# Patient Record
Sex: Female | Born: 2000 | Race: Black or African American | Hispanic: No | Marital: Single | Smoking: Never smoker
Health system: Southern US, Community
[De-identification: ages and names within clinical notes are randomized; demographics above are authoritative.]

## PROBLEM LIST (undated history)

## (undated) DIAGNOSIS — J45909 Unspecified asthma, uncomplicated: Secondary | ICD-10-CM

## (undated) HISTORY — PX: NO PAST SURGERIES: SHX2092

---
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
## 2022-06-09 ENCOUNTER — Ambulatory Visit (INDEPENDENT_AMBULATORY_CARE_PROVIDER_SITE_OTHER): Payer: Medicaid Other | Admitting: Student

## 2022-06-09 ENCOUNTER — Encounter: Payer: Self-pay | Admitting: Student

## 2022-06-09 VITALS — BP 118/62 | HR 84 | Ht 65.0 in | Wt 178.2 lb

## 2022-06-09 DIAGNOSIS — Z3009 Encounter for other general counseling and advice on contraception: Secondary | ICD-10-CM

## 2022-06-09 DIAGNOSIS — Z3041 Encounter for surveillance of contraceptive pills: Secondary | ICD-10-CM

## 2022-06-09 MED ORDER — NORETHIN-ETH ESTRAD-FE BIPHAS 1 MG-10 MCG / 10 MCG PO TABS: 1.0000 | ORAL_TABLET | Freq: Every day | ORAL | 11 refills | Status: DC

## 2022-06-09 NOTE — Progress Notes (Signed)
    SUBJECTIVE:   CHIEF COMPLAINT / HPI:   Emily Baldwin is a 22 year-old female here to establish care with new PCP.  Occupation: Works at [employer] Lives with: Mother Emergency contact: Mother- [phone] Smoking/Vaping:  None Alcohol: None Marijuana: None Ilicit substances: None   Allergies: None Daily medications: OCP Medical diagnoses: Asthma when younger Surgical hx: Wisdom teeth removal in 2021 Family hx: Nothing   She says that she is here to get a refill on her OCP. She has no history of PE or DVT.  She has not used tobacco products.  No history of migraine with aura.  She does not have hypertension, nor does she have a history of it.  PERTINENT  PMH / PSH: Reviewed  OBJECTIVE:   BP 118/62   Pulse 84   Ht 5\' 5"  (1.651 m)   Wt 178 lb 3.2 oz (80.8 kg)   LMP 05/24/2022   SpO2 98%   BMI 29.65 kg/m  General: Well-appearing female in no distress CV: Regular rate and rhythm Respiratory: Normal work of breathing on room air Abdomen: Soft nontender nondistended Extremities: Warm and well-perfused Psychiatric: Pleasant affect.  Normal mood.  Makes good eye contact.   ASSESSMENT/PLAN:   Patient is a healthy 22 year old female.  Vital signs within normal limits for age today.  No daily medications other than OCP. Discussed that we will have annual visits, and we are here to provide sick care or acute care as well.  Oral contraceptive use No contraindications to OCP. Prescribed LoLoestrin. Discussed safe sex and to use condoms with sexual intercourse to prevent sexually transmitted infections.     Orvis Brill, [place]

## 2022-06-09 NOTE — Patient Instructions (Addendum)
So wonderful meeting you today, Emily Baldwin!  You may return to work when you feel you are able to do your job. I provided a work note for the next week.  Continue icing your finger and use a splint. Make sure to start moving your finger for mobility to prevent stiffness.  We will see you for your annual exam in 1 year or sooner if needed!   If you have any questions or concerns, please feel free to call the clinic.   Have a wonderful day,  Dr. Orvis Brill [facility] [phone]

## 2022-06-12 NOTE — Assessment & Plan Note (Signed)
No contraindications to OCP. Prescribed LoLoestrin. Discussed safe sex and to use condoms with sexual intercourse to prevent sexually transmitted infections.

## 2022-07-14 ENCOUNTER — Other Ambulatory Visit: Payer: Self-pay | Admitting: *Deleted

## 2022-07-14 NOTE — Progress Notes (Signed)
RX Lo Loestrin refill request received from pharmacy. 1 refill sent with note for pt to schedule annual per protocol.

## 2022-07-18 ENCOUNTER — Telehealth: Payer: Self-pay

## 2022-07-18 DIAGNOSIS — Z3009 Encounter for other general counseling and advice on contraception: Secondary | ICD-10-CM

## 2022-07-18 MED ORDER — NORETHIN-ETH ESTRAD-FE BIPHAS 1 MG-10 MCG / 10 MCG PO TABS: 1.0000 | ORAL_TABLET | Freq: Every day | ORAL | 11 refills | Status: DC

## 2022-07-18 NOTE — Telephone Encounter (Signed)
Patient calls nurse line reporting difficulty picking birth control prescription.   I called the pharmacy and they report they do not have a recent prescription prescription with refills.   Please resend prescription.

## 2022-07-19 MED ORDER — NORETHIN-ETH ESTRAD-FE BIPHAS 1 MG-10 MCG / 10 MCG PO TABS: 1.0000 | ORAL_TABLET | Freq: Every day | ORAL | 11 refills | Status: DC

## 2022-07-19 NOTE — Telephone Encounter (Signed)
Script set to no print, not send electronically.  Resent for patient. Christen Bame, CMA

## 2022-07-19 NOTE — Addendum Note (Signed)
Addended by: Christen Bame D on: 07/19/2022 10:06 AM   Modules accepted: Orders

## 2022-07-22 ENCOUNTER — Ambulatory Visit (INDEPENDENT_AMBULATORY_CARE_PROVIDER_SITE_OTHER): Payer: Medicaid Other | Admitting: Student

## 2022-07-22 VITALS — BP 134/84 | HR 84 | Ht 65.0 in | Wt 169.6 lb

## 2022-07-22 DIAGNOSIS — S6990XA Unspecified injury of unspecified wrist, hand and finger(s), initial encounter: Secondary | ICD-10-CM | POA: Insufficient documentation

## 2022-07-22 DIAGNOSIS — S6992XD Unspecified injury of left wrist, hand and finger(s), subsequent encounter: Secondary | ICD-10-CM

## 2022-07-22 DIAGNOSIS — M20002 Unspecified deformity of left finger(s): Secondary | ICD-10-CM | POA: Diagnosis present

## 2022-07-22 NOTE — Progress Notes (Signed)
    SUBJECTIVE:   CHIEF COMPLAINT / HPI:   Emily Baldwin is a 22 y.o. female  presenting for follow up for a non-displaced fracture of the left ring finger in 05/2022. She was seen by urgent care after accidentally slamming her finger into the door. X-ray at the time showing a spiral fracture middle phalanx ring finger that was non-displaced with soft tissue swelling.   She noticed her finger now has a deformity. She is able to use her hand without significant pain but struggles with the strength for flexion. She reports good compliance with finger splint.   PERTINENT  PMH / PSH: asthma, oral contraceptive use   OBJECTIVE:   BP 134/84   Pulse 84   Ht 5' 5"$  (1.651 m)   Wt 169 lb 9.6 oz (76.9 kg)   SpO2 100%   BMI 28.22 kg/m   Well -appearing, no acute distress Cardio: Regular rate, regular rhythm, no murmurs on exam. MSK: swan neck deformity of the ring finger of the left hand, grip strength intact, tenderness to palpation on the DIP joint, normal ROM    ASSESSMENT/PLAN:   Hand injury With new deformity of the finger, will send for evaluation by Hand surgery. Patient in agreement with plan.      Darci Current, [place]

## 2022-07-22 NOTE — Assessment & Plan Note (Signed)
With new deformity of the finger, will send for evaluation by Hand surgery. Patient in agreement with plan.

## 2022-07-22 NOTE — Patient Instructions (Signed)
It was great to see you today!   Today we addressed: I am sending ain a referral to hand surgery. They will take x-rays in their office. You should hear a call back from them in 1-2 weeks, if not please call our office and let me know.   You should return to our clinic No follow-ups on file.  Please arrive 15 minutes before your appointment to ensure smooth check in process.    Please call the clinic at [phone] if your symptoms worsen or you have any concerns.  Thank you for allowing me to participate in your care, Dr. Darci Current [facility]

## 2022-11-11 ENCOUNTER — Ambulatory Visit: Payer: Medicaid Other | Admitting: Family Medicine

## 2022-11-14 ENCOUNTER — Ambulatory Visit (INDEPENDENT_AMBULATORY_CARE_PROVIDER_SITE_OTHER): Payer: Medicaid Other | Admitting: Student

## 2022-11-14 VITALS — BP 120/70 | HR 111 | Ht 66.0 in | Wt 168.0 lb

## 2022-11-14 DIAGNOSIS — N926 Irregular menstruation, unspecified: Secondary | ICD-10-CM | POA: Insufficient documentation

## 2022-11-14 DIAGNOSIS — N912 Amenorrhea, unspecified: Secondary | ICD-10-CM | POA: Diagnosis present

## 2022-11-14 LAB — POCT URINE PREGNANCY: Preg Test, Ur: NEGATIVE

## 2022-11-14 NOTE — Assessment & Plan Note (Signed)
Urine pregnancy negative in clinic today.  Discussed possible causes of a late/absent menstrual cycle including infection (gonorrhea, chlamydia, trichomonas, etc.), anovulation, pregnancy, structural abnormalities. -Plan for repeat urine pregnancy in 1 week (patient prefers to have this done in the clinic) -If she does not have a menstrual cycle within the next week, we will also collect gonorrhea, chlamydia, trichomonas, BV and yeast samples -Discussed return precautions

## 2022-11-14 NOTE — Progress Notes (Signed)
  SUBJECTIVE:   CHIEF COMPLAINT / HPI:   Emily Baldwin is a pleasant 22 year old female here for missed menses.  She has been taking combined OCP since February.  Has had regular menstrual cycles per her phone app every 4 weeks. LMP 10/10/2022.  She is feeling some slight cramping and headache.  Also having sweating at night. Otherwise, no breast tenderness nausea or vomiting.    She is sexually active with her significant other.  They do not use barrier method such as condoms.    She took a home pregnancy test on 6/6 which was negative.  No change in vaginal discharge, odor, irritation.   PERTINENT  PMH / PSH:   Past Medical History:  Diagnosis Date   Asthma     Patient Care Team: Darral Dash, DO as PCP - General (Family Medicine) OBJECTIVE:  BP 120/70 (BP Location: Left Arm, Patient Position: Sitting, Cuff Size: Large)   Pulse (!) 111   Ht 5\' 6"  (1.676 m)   Wt 168 lb (76.2 kg)   LMP 10/10/2022   SpO2 99%   BMI 27.12 kg/m  Physical Exam Constitutional:      Appearance: Normal appearance.  Cardiovascular:     Rate and Rhythm: Normal rate and regular rhythm.  Pulmonary:     Effort: Pulmonary effort is normal.  Neurological:     Mental Status: She is alert.      ASSESSMENT/PLAN:    Abnormal menses Assessment & Plan: Urine pregnancy negative in clinic today.  Discussed possible causes of a late/absent menstrual cycle including infection (gonorrhea, chlamydia, trichomonas, etc.), anovulation, pregnancy, structural abnormalities. -Plan for repeat urine pregnancy in 1 week (patient prefers to have this done in the clinic) -If she does not have a menstrual cycle within the next week, we will also collect gonorrhea, chlamydia, trichomonas, BV and yeast samples -Discussed return precautions    No follow-ups on file. Darral Dash, DO 11/14/2022, 2:18 PM PGY-2, [facility]

## 2022-11-14 NOTE — Patient Instructions (Signed)
It was great seeing you today.  We will see you in 1 week for follow-up. I encourage using condoms with sexual intercourse to prevent undesired pregnancy.   If you have any questions or concerns, please feel free to call the clinic.   Have a wonderful day,  Dr. Darral Dash [facility] [phone]

## 2022-11-23 ENCOUNTER — Ambulatory Visit (INDEPENDENT_AMBULATORY_CARE_PROVIDER_SITE_OTHER): Payer: Medicaid Other | Admitting: Student

## 2022-11-23 ENCOUNTER — Encounter: Payer: Self-pay | Admitting: Student

## 2022-11-23 DIAGNOSIS — N912 Amenorrhea, unspecified: Secondary | ICD-10-CM

## 2022-11-23 LAB — POCT URINALYSIS DIP (MANUAL ENTRY)
Glucose, UA: NEGATIVE mg/dL
Leukocytes, UA: NEGATIVE
Nitrite, UA: NEGATIVE
Protein Ur, POC: NEGATIVE mg/dL
Spec Grav, UA: 1.025 (ref 1.010–1.025)
Urobilinogen, UA: 1 E.U./dL
pH, UA: 7 (ref 5.0–8.0)

## 2022-11-23 LAB — POCT URINE PREGNANCY: Preg Test, Ur: NEGATIVE

## 2022-11-23 MED ORDER — PRENATAL MULTIVITAMIN CH: 1.0000 | ORAL_TABLET | Freq: Every day | ORAL | 3 refills | Status: AC

## 2022-11-23 NOTE — Progress Notes (Signed)
  SUBJECTIVE:   CHIEF COMPLAINT / HPI:   Emily Baldwin is a 22 year old female here for amenorrhea.  Her boyfriend is present for this visit.  Her LMP was 10/10/2022.  We had a visit last week, and that she had a negative pregnancy test in the office. She is still yet to have a menstrual cycle. Reports some nausea and breast tenderness.  Having a lot of sweating at night. Took a few urine pregnancy tests this morning, and 2 out of the 3 had a faint double line indicating pregnancy.  Denies any vaginal itching, burning, odor.  No concern for STDs, she says she has been sexually active in her monogamous relationship with only her boyfriend for the past 2 years.  Denies acne or excessive facial hair.  Still taking her combined OCP. PERTINENT  PMH / PSH:   Past Medical History:  Diagnosis Date   Asthma     Patient Care Team: Darral Dash, DO as PCP - General (Family Medicine) OBJECTIVE:  BP (!) 113/52   Pulse 62   Ht 5\' 6"  (1.676 m)   Wt 169 lb (76.7 kg)   LMP 10/10/2022   SpO2 100%   BMI 27.28 kg/m  General: Alert and cooperative and appears to be in no acute distress Cardio: Normal S1 and S2, no S3 or S4. Rhythm is regular. No murmurs or rubs.   Pulm: Clear to auscultation bilaterally, no crackles, wheezing, or diminished breath sounds. Normal respiratory effort Neuro: Cranial nerves grossly intact   ASSESSMENT/PLAN:  Amenorrhea Assessment & Plan: Urine pregnancy negative in clinic again today.  Will obtain serum beta-hCG as well as prolactin, TSH, testosterone to rule out other underlying causes for amenorrhea. Provided prescription for patient to start taking prenatal vitamin in the event that she is pregnant and that her hCG levels are not yet detected on urine pregnancy.  Discussed that she should continue taking these while she is of childbearing age. Will discontinue OCP until labs result. Discussed safe sex methods.  Orders: -     POCT urine pregnancy -      Prolactin -     TSH -     Testosterone -     POCT urinalysis dipstick -     Urine Culture -     Beta hCG quant (ref lab)  Other orders -     prenatal multivitamin; Take 1 tablet by mouth daily at 12 noon.  Dispense: 90 tablet; Refill: 3   No follow-ups on file. Darral Dash, DO 11/23/2022, 7:41 PM PGY-2, [facility]

## 2022-11-23 NOTE — Assessment & Plan Note (Signed)
Urine pregnancy negative in clinic again today.  Will obtain serum beta-hCG as well as prolactin, TSH, testosterone to rule out other underlying causes for amenorrhea. Provided prescription for patient to start taking prenatal vitamin in the event that she is pregnant and that her hCG levels are not yet detected on urine pregnancy.  Discussed that she should continue taking these while she is of childbearing age. Will discontinue OCP until labs result. Discussed safe sex methods.

## 2022-11-23 NOTE — Patient Instructions (Signed)
It was great seeing you today.  Stop taking your birth control pill until we get your blood results back. We are going to check a thyroid level, beta-hCG, and hormone levels.  I will call you with the results. In the meantime, start taking a prenatal vitamin.  I sent this into your pharmacy.   If you have any questions or concerns, please feel free to call the clinic.   Have a wonderful day,  Dr. Darral Dash [facility] [phone]

## 2022-11-24 LAB — TSH: TSH: 1 u[IU]/mL (ref 0.450–4.500)

## 2022-11-24 LAB — TESTOSTERONE: Testosterone: 30 ng/dL (ref 13–71)

## 2022-11-24 LAB — PROLACTIN: Prolactin: 28.2 ng/mL (ref 4.8–33.4)

## 2022-11-24 LAB — BETA HCG QUANT (REF LAB): hCG Quant: <1 m[IU]/mL

## 2022-11-25 LAB — URINE CULTURE

## 2022-11-28 ENCOUNTER — Telehealth: Payer: Self-pay

## 2022-11-28 NOTE — Telephone Encounter (Signed)
Patient calls nurse line regarding follow up from visit last week.   She reports that she had small amount of spotting and cramping on Friday and Saturday, however, has not had a normal period.   She would like to speak with Dr. Melissa Noon about this as well as ask about stopping birth control pills.   Requesting returned call at [phone].  Veronda Prude, RN

## 2022-11-29 NOTE — Telephone Encounter (Signed)
Discussed with patient over the phone regarding abnormal menses.  She is having some spotting and cramping and is unsure if this is a menstrual cycle or not.  Would like to come in and discuss various birth control options.  She says that she is unsure if she wants to stay on the OCP because she does not like "how it makes me feel."  Scheduled appointment at 10:50 on 6/28 with Dr. Deirdre Priest at [facility] clinic for further contraceptive discussion.  Darral Dash, DO

## 2022-12-02 ENCOUNTER — Ambulatory Visit (INDEPENDENT_AMBULATORY_CARE_PROVIDER_SITE_OTHER): Payer: Medicaid Other | Admitting: Family Medicine

## 2022-12-02 ENCOUNTER — Encounter: Payer: Self-pay | Admitting: Family Medicine

## 2022-12-02 ENCOUNTER — Other Ambulatory Visit: Payer: Self-pay

## 2022-12-02 VITALS — BP 123/90 | HR 72 | Ht 65.0 in | Wt 170.2 lb

## 2022-12-02 DIAGNOSIS — N926 Irregular menstruation, unspecified: Secondary | ICD-10-CM | POA: Diagnosis present

## 2022-12-02 NOTE — Patient Instructions (Signed)
Good to see you today - Thank you for coming in  Things we discussed today:  Continue to take your BCP every day around the same time  Take the prenatal vitamin daily about 3 months before you want to conceive  Start getting healthy  OVIA pregnancy app   Let us know if any problems with your periods

## 2022-12-02 NOTE — Assessment & Plan Note (Signed)
Improved.  Likely due to missing a few days of OCPs.  No signs on pregnancy on lab work.   Continue OCP till late this year.  Discussed prenatal vitamins and getting staying healthy.

## 2022-12-02 NOTE — Progress Notes (Signed)
    SUBJECTIVE:   CHIEF COMPLAINT / HPI:    Irregular menstrual periods Her boyfriend is present and very involved in decision making.  Has a little spotting in early June.  None since.  Taking bcps regularly.  Has an app that tracks her menstrual periods  Wishes to become pregnant early next year.  Plans to use BCPs till then   OBJECTIVE:   BP (!) 123/90   Pulse 72   Ht 5\' 5"  (1.651 m)   Wt 170 lb 3.2 oz (77.2 kg)   LMP 10/10/2022   SpO2 100%   BMI 28.32 kg/m   Healthy appearing  ASSESSMENT/PLAN:   Abnormal menses Assessment & Plan: Improved.  Likely due to missing a few days of OCPs.  No signs on pregnancy on lab work.   Continue OCP till late this year.  Discussed prenatal vitamins and getting staying healthy.        Patient Instructions  Good to see you today - Thank you for coming in  Things we discussed today:  Continue to take your BCP every day around the same time  Take the prenatal vitamin daily about 3 months before you want to conceive  Start getting healthy  OVIA pregnancy app   Let us know if any problems with your periods   Carney Living, MD [facility]

## 2024-04-10 ENCOUNTER — Ambulatory Visit: Admitting: Family Medicine

## 2024-04-12 ENCOUNTER — Ambulatory Visit: Payer: Self-pay | Admitting: Family Medicine

## 2024-04-12 ENCOUNTER — Ambulatory Visit (INDEPENDENT_AMBULATORY_CARE_PROVIDER_SITE_OTHER): Admitting: Family Medicine

## 2024-04-12 ENCOUNTER — Encounter: Payer: Self-pay | Admitting: Family Medicine

## 2024-04-12 VITALS — BP 128/77 | HR 84 | Ht 66.0 in | Wt 147.6 lb

## 2024-04-12 DIAGNOSIS — N946 Dysmenorrhea, unspecified: Secondary | ICD-10-CM

## 2024-04-12 DIAGNOSIS — R030 Elevated blood-pressure reading, without diagnosis of hypertension: Secondary | ICD-10-CM

## 2024-04-12 DIAGNOSIS — R300 Dysuria: Secondary | ICD-10-CM | POA: Diagnosis present

## 2024-04-12 LAB — POCT URINALYSIS DIP (MANUAL ENTRY)
Bilirubin, UA: NEGATIVE
Glucose, UA: NEGATIVE mg/dL
Ketones, POC UA: NEGATIVE mg/dL
Nitrite, UA: NEGATIVE
Protein Ur, POC: 100 mg/dL — AB
Spec Grav, UA: 1.025 (ref 1.010–1.025)
Urobilinogen, UA: 0.2 U/dL
pH, UA: 7 (ref 5.0–8.0)

## 2024-04-12 LAB — POCT URINE PREGNANCY: Preg Test, Ur: NEGATIVE

## 2024-04-12 MED ORDER — NAPROXEN 500 MG PO TABS: ORAL_TABLET | ORAL | 2 refills | Status: AC

## 2024-04-12 MED ORDER — CEPHALEXIN 500 MG PO CAPS: 500.0000 mg | ORAL_CAPSULE | Freq: Two times a day (BID) | ORAL | 0 refills | Status: AC

## 2024-04-12 MED ORDER — ONDANSETRON 4 MG PO TBDP: 4.0000 mg | ORAL_TABLET | Freq: Three times a day (TID) | ORAL | 0 refills | Status: AC | PRN

## 2024-04-12 NOTE — Patient Instructions (Addendum)
 It was so good to see you today! Thank you for allowing me to take care of you.  Today we discussed the following concerns and plans:  Burning with urination - we checked your urine today - I will let you know the results  Painful menstrual cramps - Take 500 mg (1 tablet) daily 2 days prior to the onset of menstrual period.   - Once period starts take 1 tablet every 12 hours as needed for cramping. - Take Zofran as needed for nausea/vomiting - stay well hydrated even if you don't have much appetite - try to incorporate some gentle physical activity - this can actually make cramping feel better!  If you have any concerns, please call the clinic or schedule an appointment.  It was a pleasure to take care of you today. Be well!  Lauraine Norse, DO [facility], PGY-2  Do you need your medications delivered to your home?   We'll send your prescription to the [facility] for delivery.          Address: [address] [place], [place], [place] [address]          Phone: [phone]  Please call the [facility] to speak with a pharmacist and set up your home medication delivery. If you have any questions, feel free to contact us  -- we're happy to help!  Other [facility] that offer affordable prices on both prescriptions and over-the-counter items, as well as convenient services like vaccinations, are  [facility], at [facility]         Address:  [address], [place], [place] [address]         Phone: [phone]  [facility], located in the [facility]        Address: [address] [place], [place] [address]        Phone: [phone]  [facility], at [facility]       Address: [address], [place], [place] [address]       Phone: [phone]  [facility], at [facility]       Address: [address], [place], [place] [address]       Phone: [phone]

## 2024-04-12 NOTE — Progress Notes (Signed)
    SUBJECTIVE:   CHIEF COMPLAINT / HPI:   Urinary symptoms Started Monday 11/3. Burning with urination, worse when she drinks soda. Has noticed spots of blood in urine. No abdominal pain or back pain. No fevers, nausea, vomiting. Has tried something OTC but she cannot remember what (denies AZO).  She has read online about UTIs, kidney infections, and kidney failure and is very concerned about her current symptoms.  Vaginal bleeding and dysmenorrhea Not currently on BC. Not interested in other options. Does not want to be pregnant. Has been using condoms. She has had irregular menses/amenorrhea in the past, worked up with TSH, prolactin WNL. FDLMP: about 1 month ago; she states she is due for period in a couple of days Menses last 5-6 days, usually occur every 1 month. Heavy bleeding? For the first day. Has to use 3-4 pads or tampons first day. Severe cramping, and has nausea first day or so of menses. This keeps her in bed and interrupts her daily activities. She has tried tylenol and ibuprofen , Midol  but it does not help cramps.  PERTINENT  PMH / PSH: Reviewed.  OBJECTIVE:   BP 128/77   Pulse 84   Ht 5' 6 (1.676 m)   Wt 147 lb 9.6 oz (67 kg)   LMP 03/15/2024   SpO2 99%   BMI 23.82 kg/m   General: well-appearing, no acute distress. HEENT: normocephalic, PERRLA, EOM grossly intact, MMM, bilateral TM visualized without erythema or bulging. Cardio: Regular rate, regular rhythm, no murmurs on exam. Pulm: No increased work of breathing. Abdominal: bowel sounds present, soft, non-tender, non-distended. No HSM. Extremities: no peripheral edema. Moves all extremities equally. Neuro: Alert and oriented x3, speech normal in content, no facial asymmetry  Psych:  Cognition and judgment appear intact. She is anxious about symptoms.  ASSESSMENT/PLAN:   Assessment & Plan Dysuria Given symptoms and recent onset I do suspect UTI.  Considered interstitial cystitis as well;  recommend additional workup for this in future if if her UTI and/or symptoms recur.  Low concern for pyelonephritis without CVA tenderness, systemic symptoms. - UA today; will send antibiotics as indicated  Late addition: UA with large leuks.  Will treat with Keflex 500 mg twice daily x 5 days. Dysmenorrhea Patient has history of painful periods with heavy bleeding in the first day or so.  She also has severe nausea and vomiting which precludes her from participating in normal life. She reports needing 3-4 pads/tampons in the first day menstrual cycle; I have low concern for anemia. - I recommend naproxen 500 mg daily 2 days prior to onset of menses.  Once menses begins that she should take naproxen 500 mg twice daily as needed. - I have also sent in Zofran as needed for nausea vomiting - Counseled on good hydration Transient hypertension Elevated blood pressure noted during this visit. She has been reading a lot online about UTIs and kidney problems.  She is very anxious about her symptoms and concerned that she will have long-term consequences due to UTI. Reviewed blood pressure readings from prior visits and nearly all been in healthy range for her age. Counseling and reassurance given. Strongly suspect that elevated blood pressure today is due to this anxiety; no red flags on exam.  On recheck blood pressure is normalized.    Lauraine Norse, DO [facility]
